# Patient Record
Sex: Female | Born: 2000 | Race: Black or African American | Hispanic: No | Marital: Single | Smoking: Never smoker
Health system: Southern US, Community
[De-identification: ages and names within clinical notes are randomized; demographics above are authoritative.]

## PROBLEM LIST (undated history)

## (undated) DIAGNOSIS — J309 Allergic rhinitis, unspecified: Secondary | ICD-10-CM

## (undated) DIAGNOSIS — J45909 Unspecified asthma, uncomplicated: Secondary | ICD-10-CM

## (undated) HISTORY — DX: Allergic rhinitis, unspecified: J30.9

## (undated) HISTORY — DX: Unspecified asthma, uncomplicated: J45.909

---
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
## 2020-03-19 DIAGNOSIS — H2142 Pupillary membranes, left eye: Secondary | ICD-10-CM | POA: Diagnosis not present

## 2020-03-19 DIAGNOSIS — H47393 Other disorders of optic disc, bilateral: Secondary | ICD-10-CM | POA: Diagnosis not present

## 2020-03-19 DIAGNOSIS — Z83511 Family history of glaucoma: Secondary | ICD-10-CM | POA: Diagnosis not present

## 2020-03-19 DIAGNOSIS — R519 Headache, unspecified: Secondary | ICD-10-CM | POA: Diagnosis not present

## 2022-01-01 ENCOUNTER — Encounter: Payer: Self-pay | Admitting: Family Medicine

## 2022-01-01 ENCOUNTER — Ambulatory Visit: Payer: BC Managed Care – PPO | Admitting: Family Medicine

## 2022-01-01 VITALS — BP 114/70 | HR 88 | Temp 97.7°F | Resp 16 | Ht 66.5 in | Wt 165.4 lb

## 2022-01-01 DIAGNOSIS — J302 Other seasonal allergic rhinitis: Secondary | ICD-10-CM

## 2022-01-01 DIAGNOSIS — M25562 Pain in left knee: Secondary | ICD-10-CM

## 2022-01-01 DIAGNOSIS — F419 Anxiety disorder, unspecified: Secondary | ICD-10-CM | POA: Diagnosis not present

## 2022-01-01 DIAGNOSIS — J4520 Mild intermittent asthma, uncomplicated: Secondary | ICD-10-CM | POA: Diagnosis not present

## 2022-01-01 NOTE — Progress Notes (Signed)
? ?Subjective:  ? ? Patient ID: Ann Mckinney, female    DOB: 07-16-2001, 21 y.o.   MRN: [id number] ? ?HPI ?New to establish.  Previous PCP- Dr Dion Body.  Last seen ~1 yr ago. ? ?L knee pain- sxs started ~1 month ago.  Noted a bony lump on L patella.  This is the area where she will have pain.  No known injury.  Was active in track and field.  Pain tends to come and go but lump remains constant. ? ?Allergies- pt has mix of seasonal/food/pet allergies.  Completed round of allergy shots.  Has been doing much better.  Pt is not even having to use her Zyrtec at this time. ? ?Asthma- currently well controlled and managing w/ albuterol as needed.  Rarely requires inhaler.  Tends to have issues if she has respiratory illness or rapid weather changes. ? ?Anxiety- sxs started 'a couple of months ago'.  'it's a constant thing.  I'm always worried about something'.  Feels on edge.  Increased irritability.  Racing thoughts.  Pt reports she is having more bad days than good.  Is supposed to start therapy upcoming.   ? 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  Not currently in a relationship.  No concerns for sexually transmitted infections.  Uses condoms.  Not smoking or vaping.  No alcohol.  No drug use.  Wearing seatbelt.  Goes to the gym regularly.  ? ? ?Review of Systems ?For ROS see HPI  ? ?This visit occurred during the SARS-CoV-2 public health emergency.  Safety protocols were in place, including screening questions prior to the visit, additional usage of staff PPE, and extensive cleaning of exam room while observing appropriate contact time as indicated for disinfecting solutions.   ?   ?Objective:  ? Physical Exam ?Vitals reviewed.  ?Constitutional:   ?   General: She is not in acute distress. ?   Appearance: Normal appearance. She is well-developed. She is not ill-appearing.  ?HENT:  ?   Head: Normocephalic and atraumatic.  ?Eyes:  ?   Conjunctiva/sclera: Conjunctivae normal.  ?   Pupils: Pupils  are equal, round, and reactive to light.  ?Neck:  ?   Thyroid: No thyromegaly.  ?Cardiovascular:  ?   Rate and Rhythm: Normal rate and regular rhythm.  ?   Pulses: Normal pulses.  ?   Heart sounds: Normal heart sounds. No murmur heard. ?Pulmonary:  ?   Effort: Pulmonary effort is normal. No respiratory distress.  ?   Breath sounds: Normal breath sounds.  ?Abdominal:  ?   General: There is no distension.  ?   Palpations: Abdomen is soft.  ?   Tenderness: There is no abdominal tenderness.  ?Musculoskeletal:     ?   General: Deformity (bony deformity noted on upper lateral patella, TTP) present.  ?   Cervical back: Normal range of motion and neck supple.  ?   Right lower leg: No edema.  ?   Left lower leg: No edema.  ?Lymphadenopathy:  ?   Cervical: No cervical adenopathy.  ?Skin: ?   General: Skin is warm and dry.  ?Neurological:  ?   General: No focal deficit present.  ?   Mental Status: She is alert and oriented to person, place, and time.  ?Psychiatric:     ?   Mood and Affect: Mood normal.     ?   Behavior: Behavior normal.     ?   Thought Content: Thought content normal.  ? ? ? ? ? ?   ?  Assessment & Plan:  ? ? ?

## 2022-01-01 NOTE — Patient Instructions (Addendum)
Schedule your complete physical in 6 months ?IF you need to revisit the idea of medicine- let me know! ?We'll call you with your Sports Med appt ?If you have knee pain- ibuprofen and ice!! ?You should be SO proud of yourself!! ?Keep up the good work!  You look fabulous!!! ?Call with any questions or concerns ?Welcome!  We're glad to have you!!! ?

## 2022-01-04 NOTE — Assessment & Plan Note (Signed)
New to provider, ongoing for pt.  She reports much better control since having allergy shots.  Not even taking Zyrtec at this time.  Will follow. ?

## 2022-01-04 NOTE — Assessment & Plan Note (Signed)
New.  Pt w/ bony deformity on superior-lateral edge of patella.  + TTP.  Will refer to Sports Medicine for complete evaluation and tx.  Pt expressed understanding and is in agreement w/ plan.  ?

## 2022-01-04 NOTE — Assessment & Plan Note (Signed)
New.  Pt reports sxs started a few months ago and she feels that she is constantly worrying about something.  Has increased irritability, racing thoughts.  Feels she is having more bad days than good.  Is not interested in meds at this time, but does plan to start therapy.  Wants to see how this goes first before taking medication.  I understand and told her we can revisit this at any time. ?

## 2022-01-04 NOTE — Assessment & Plan Note (Signed)
New to provider, ongoing for pt.  Reports that she is rarely requiring albuterol at this time b/c her allergies are under much better control.  Typically only flares w/ extreme weather changes and URIs. ?

## 2022-01-20 ENCOUNTER — Ambulatory Visit (INDEPENDENT_AMBULATORY_CARE_PROVIDER_SITE_OTHER): Payer: BC Managed Care – PPO | Admitting: Family Medicine

## 2022-01-20 ENCOUNTER — Encounter: Payer: Self-pay | Admitting: Family Medicine

## 2022-01-20 ENCOUNTER — Ambulatory Visit: Payer: Self-pay

## 2022-01-20 VITALS — BP 106/70 | Ht 66.5 in | Wt 165.0 lb

## 2022-01-20 DIAGNOSIS — M76899 Other specified enthesopathies of unspecified lower limb, excluding foot: Secondary | ICD-10-CM | POA: Diagnosis not present

## 2022-01-20 DIAGNOSIS — M25562 Pain in left knee: Secondary | ICD-10-CM

## 2022-01-20 NOTE — Assessment & Plan Note (Signed)
Acutely occurring.  Pain likely associated with the spurring appreciated off the patella ?-Counseled on home exercise therapy and supportive care. ?-Pursue shockwave therapy ?

## 2022-01-20 NOTE — Patient Instructions (Signed)
Nice to meet you ?Please try heat on the area  ?Please try the exercises   ?Please send me a message in MyChart with any questions or updates.  ?Please see me back to start shockwave therapy.  ? ?--Dr. Jordan Likes ? ?

## 2022-01-20 NOTE — Progress Notes (Signed)
?  Ann Mckinney - 21 y.o. female MRN [id number]  Date of birth: 03-08-2001 ? ?SUBJECTIVE:  Including CC & ROS.  ?No chief complaint on file. ? ? ?Ann Mckinney is a 21 y.o. female that is presenting with acute left knee pain.  The pain is occurring over the patella.  Denies any injury.  It is worse when she stands at work.  Does get swelling intermittently.  No pain with lifting weights or exercising. ? ? ?Review of Systems ?See HPI  ? ?HISTORY: Past Medical, Surgical, Social, and Family History Reviewed & Updated per EMR.   ?Pertinent Historical Findings include: ? ?Past Medical History:  ?Diagnosis Date  ? Allergic rhinitis   ? Asthma   ? induced by allergies  ? ? ?Past Surgical History:  ?Procedure Laterality Date  ? WISDOM TOOTH EXTRACTION  05/2017  ? ? ? ?PHYSICAL EXAM:  ?VS: BP 106/70 (BP Location: Left Arm, Patient Position: Sitting)   Ht 5' 6.5" (1.689 m)   Wt 165 lb (74.8 kg)   BMI 26.23 kg/m?  ?Physical Exam ?Gen: NAD, alert, cooperative with exam, well-appearing ?MSK:  ?Neurovascularly intact   ? ?Limited ultrasound: Left knee: ? ?No effusion suprapatellar pouch. ?There is a spur within the quadriceps tendon where she localizes the most intense pain. ?Normal-appearing patellar tendon. ?Normal-appearing medial and lateral joint space ? ?Summary: Spurring into the quadriceps tendon ? ?Ultrasound and interpretation by Clare Gandy, MD ? ? ? ?ASSESSMENT & PLAN:  ? ?Quadriceps tendinitis ?Acutely occurring.  Pain likely associated with the spurring appreciated off the patella ?-Counseled on home exercise therapy and supportive care. ?-Pursue shockwave therapy ? ? ? ? ?

## 2022-01-26 ENCOUNTER — Ambulatory Visit: Payer: BC Managed Care – PPO | Admitting: Family Medicine

## 2022-02-02 ENCOUNTER — Encounter: Payer: Self-pay | Admitting: Family Medicine

## 2022-02-02 ENCOUNTER — Ambulatory Visit (INDEPENDENT_AMBULATORY_CARE_PROVIDER_SITE_OTHER): Payer: Self-pay | Admitting: Family Medicine

## 2022-02-02 DIAGNOSIS — M76899 Other specified enthesopathies of unspecified lower limb, excluding foot: Secondary | ICD-10-CM

## 2022-02-02 NOTE — Progress Notes (Signed)
?  Ann Mckinney - 21 y.o. female MRN [id number]  Date of birth: Oct 16, 2001 ? ?SUBJECTIVE:  Including CC & ROS.  ?No chief complaint on file. ? ? ?Ann Mckinney is a 21 y.o. female that is here for shockwave therapy. ? ? ?Review of Systems ?See HPI  ? ?HISTORY: Past Medical, Surgical, Social, and Family History Reviewed & Updated per EMR.   ?Pertinent Historical Findings include: ? ?Past Medical History:  ?Diagnosis Date  ? Allergic rhinitis   ? Asthma   ? induced by allergies  ? ? ?Past Surgical History:  ?Procedure Laterality Date  ? WISDOM TOOTH EXTRACTION  05/2017  ? ? ? ?PHYSICAL EXAM:  ?VS: Ht 5' 6.5" (1.689 m)   Wt 165 lb (74.8 kg)   BMI 26.23 kg/m?  ?Physical Exam ?Gen: NAD, alert, cooperative with exam, well-appearing ?MSK:  ?Neurovascularly intact   ? ?ECSWT Note ?Ann Mckinney ?11/29/2000 ? ?Procedure: ECSWT ?Indications: left knee pain  ? ?Procedure Details ?Consent: Risks of procedure as well as the alternatives and risks of each were explained to the (patient/caregiver).  Consent for procedure obtained. ?Time Out: Verified patient identification, verified procedure, site/side was marked, verified correct patient position, special equipment/implants available, medications/allergies/relevent history reviewed, required imaging and test results available.  Performed.  The area was cleaned with iodine and alcohol swabs.   ? ?The left knee was targeted for Extracorporeal shockwave therapy.  ? ?Preset: Patellar tendinopathy ?Power Level: 70 ?Frequency: 10 ?Impulse/cycles: 2200 ?Head size: Large ?Session: 1st ? ?Patient did tolerate procedure well. ? ? ? ?ASSESSMENT & PLAN:  ? ?Quadriceps tendinitis ?Completed shockwave therapy. ? ? ? ? ?

## 2022-02-02 NOTE — Assessment & Plan Note (Signed)
Completed shockwave therapy  

## 2022-02-13 DIAGNOSIS — F4325 Adjustment disorder with mixed disturbance of emotions and conduct: Secondary | ICD-10-CM | POA: Diagnosis not present

## 2022-02-18 ENCOUNTER — Ambulatory Visit (INDEPENDENT_AMBULATORY_CARE_PROVIDER_SITE_OTHER): Payer: BC Managed Care – PPO | Admitting: Family Medicine

## 2022-02-18 ENCOUNTER — Ambulatory Visit: Payer: Self-pay

## 2022-02-18 ENCOUNTER — Encounter: Payer: Self-pay | Admitting: Family Medicine

## 2022-02-18 VITALS — BP 98/60 | Ht 66.5 in | Wt 165.0 lb

## 2022-02-18 DIAGNOSIS — M222X1 Patellofemoral disorders, right knee: Secondary | ICD-10-CM | POA: Diagnosis not present

## 2022-02-18 DIAGNOSIS — M25561 Pain in right knee: Secondary | ICD-10-CM

## 2022-02-18 NOTE — Progress Notes (Signed)
?  Ann Mckinney - 21 y.o. female MRN [id number]  Date of birth: 12-13-2000 ? ?SUBJECTIVE:  Including CC & ROS.  ?No chief complaint on file. ? ? ?Ann Mckinney is a 22 y.o. female that is presenting with acute right knee pain.  She is feeling tilting of the kneecap while she was doing a squat.  No specific injury.  Pain is anterior nature. ? ? ? ?Review of Systems ?See HPI  ? ?HISTORY: Past Medical, Surgical, Social, and Family History Reviewed & Updated per EMR.   ?Pertinent Historical Findings include: ? ?Past Medical History:  ?Diagnosis Date  ? Allergic rhinitis   ? Asthma   ? induced by allergies  ? ? ?Past Surgical History:  ?Procedure Laterality Date  ? WISDOM TOOTH EXTRACTION  05/2017  ? ? ? ?PHYSICAL EXAM:  ?VS: BP 98/60 (BP Location: Left Arm, Patient Position: Sitting)   Ht 5' 6.5" (1.689 m)   Wt 165 lb (74.8 kg)   BMI 26.23 kg/m?  ?Physical Exam ?Gen: NAD, alert, cooperative with exam, well-appearing ?MSK:  ?Neurovascularly intact   ? ?Limited ultrasound: Right knee: ? ?No effusion in the suprapatellar pouch. ?Normal-appearing quadricep and patellar tendon. ?No changes in the medial or lateral compartment. ? ?Summary: No structural changes appreciated ? ?Ultrasound and interpretation by Clare Gandy, MD ? ? ? ?ASSESSMENT & PLAN:  ? ?Patellofemoral pain syndrome of right knee ?Acutely occurring.  Symptoms most consistent with patellofemoral syndrome ?-Counseled on home exercise therapy and supportive care. ?-Could consider insoles or  orthotics given she has more pes planus feet. ?-Could consider physical therapy. ? ? ? ? ?

## 2022-02-18 NOTE — Assessment & Plan Note (Signed)
Acutely occurring.  Symptoms most consistent with patellofemoral syndrome ?-Counseled on home exercise therapy and supportive care. ?-Could consider insoles or  orthotics given she has more pes planus feet. ?-Could consider physical therapy. ?

## 2022-02-18 NOTE — Patient Instructions (Signed)
Good to see you ?Please use ice as needed  ?Please try the exercises  ?You can try voltaren over the counter as needed  ?Please send me a message in MyChart with any questions or updates.  ?Please see me back in 4 weeks or as needed if better.  ? ?--Dr. Jordan Likes  ? ?

## 2022-02-20 DIAGNOSIS — F4320 Adjustment disorder, unspecified: Secondary | ICD-10-CM | POA: Diagnosis not present

## 2022-02-25 DIAGNOSIS — F4320 Adjustment disorder, unspecified: Secondary | ICD-10-CM | POA: Diagnosis not present

## 2022-03-13 DIAGNOSIS — F4320 Adjustment disorder, unspecified: Secondary | ICD-10-CM | POA: Diagnosis not present

## 2022-03-18 ENCOUNTER — Ambulatory Visit (INDEPENDENT_AMBULATORY_CARE_PROVIDER_SITE_OTHER): Payer: BC Managed Care – PPO | Admitting: Family Medicine

## 2022-03-18 ENCOUNTER — Encounter: Payer: Self-pay | Admitting: Family Medicine

## 2022-03-18 DIAGNOSIS — M222X1 Patellofemoral disorders, right knee: Secondary | ICD-10-CM

## 2022-03-18 NOTE — Assessment & Plan Note (Signed)
Not currently having any pain.  Still having a little crackly sensation from time to time. -Counseled on home exercise therapy and supportive care. -Could consider physical therapy or further imaging.

## 2022-03-18 NOTE — Progress Notes (Signed)
  Ann Mckinney - 21 y.o. female MRN [id number]  Date of birth: February 20, 2001  SUBJECTIVE:  Including CC & ROS.  No chief complaint on file.   Ann Mckinney is a 21 y.o. female that is following up for her right knee pain.  She is still having some crackling sensation but no pain.  She is still active.    Review of Systems See HPI   HISTORY: Past Medical, Surgical, Social, and Family History Reviewed & Updated per EMR.   Pertinent Historical Findings include:  Past Medical History:  Diagnosis Date   Allergic rhinitis    Asthma    induced by allergies    Past Surgical History:  Procedure Laterality Date   WISDOM TOOTH EXTRACTION  05/2017     PHYSICAL EXAM:  VS: BP (!) 108/58 (BP Location: Left Arm, Patient Position: Sitting)   Ht 5' 6.5" (1.689 m)   Wt 165 lb (74.8 kg)   BMI 26.23 kg/m  Physical Exam Gen: NAD, alert, cooperative with exam, well-appearing MSK:  Neurovascularly intact       ASSESSMENT & PLAN:   Patellofemoral pain syndrome of right knee Not currently having any pain.  Still having a little crackly sensation from time to time. -Counseled on home exercise therapy and supportive care. -Could consider physical therapy or further imaging.

## 2022-04-01 DIAGNOSIS — F4325 Adjustment disorder with mixed disturbance of emotions and conduct: Secondary | ICD-10-CM | POA: Diagnosis not present

## 2022-04-08 DIAGNOSIS — F4325 Adjustment disorder with mixed disturbance of emotions and conduct: Secondary | ICD-10-CM | POA: Diagnosis not present

## 2022-04-13 DIAGNOSIS — F4325 Adjustment disorder with mixed disturbance of emotions and conduct: Secondary | ICD-10-CM | POA: Diagnosis not present

## 2022-05-27 DIAGNOSIS — S41051A Open bite of right shoulder, initial encounter: Secondary | ICD-10-CM | POA: Diagnosis not present

## 2022-05-27 DIAGNOSIS — S61451A Open bite of right hand, initial encounter: Secondary | ICD-10-CM | POA: Diagnosis not present

## 2022-05-27 DIAGNOSIS — S51851A Open bite of right forearm, initial encounter: Secondary | ICD-10-CM | POA: Diagnosis not present

## 2022-05-27 DIAGNOSIS — W540XXA Bitten by dog, initial encounter: Secondary | ICD-10-CM | POA: Diagnosis not present

## 2022-06-29 ENCOUNTER — Telehealth: Payer: Self-pay | Admitting: Family Medicine

## 2022-06-29 NOTE — Telephone Encounter (Signed)
Attempted to reach pt to see exactly what test she is needing . Unable to leave a VM due to mailbox is full and no answer

## 2022-06-29 NOTE — Telephone Encounter (Signed)
Caller name: Anniston Nellums  On DPR? :yes/no: Yes  Call back number: [phone]  Provider they see: Beverely Low  Reason for call:  Pt calling b/c stating that she needs an irgra test. Please advise.

## 2022-06-30 ENCOUNTER — Other Ambulatory Visit: Payer: Self-pay

## 2022-06-30 ENCOUNTER — Telehealth: Payer: Self-pay | Admitting: Family Medicine

## 2022-06-30 DIAGNOSIS — Z111 Encounter for screening for respiratory tuberculosis: Secondary | ICD-10-CM

## 2022-06-30 NOTE — Telephone Encounter (Signed)
Pt is coming in tomorrow for lab visit only and order for TB is in

## 2022-06-30 NOTE — Telephone Encounter (Signed)
Caller name: Jynesis Nakamura   On DPR? 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

## 2022-07-01 ENCOUNTER — Other Ambulatory Visit: Payer: BC Managed Care – PPO

## 2022-07-01 DIAGNOSIS — Z111 Encounter for screening for respiratory tuberculosis: Secondary | ICD-10-CM | POA: Diagnosis not present

## 2022-07-03 DIAGNOSIS — F4325 Adjustment disorder with mixed disturbance of emotions and conduct: Secondary | ICD-10-CM | POA: Diagnosis not present

## 2022-07-03 LAB — QUANTIFERON-TB GOLD PLUS
Mitogen-NIL: >10 IU/mL
NIL: 0.03 IU/mL
QuantiFERON-TB Gold Plus: NEGATIVE
TB1-NIL: <0 IU/mL
TB2-NIL: 0 IU/mL

## 2022-07-05 ENCOUNTER — Encounter: Payer: BC Managed Care – PPO | Admitting: Family Medicine

## 2022-07-09 ENCOUNTER — Encounter: Payer: Self-pay | Admitting: Family Medicine

## 2022-07-09 ENCOUNTER — Ambulatory Visit (INDEPENDENT_AMBULATORY_CARE_PROVIDER_SITE_OTHER): Payer: BC Managed Care – PPO | Admitting: Family Medicine

## 2022-07-09 VITALS — BP 110/70 | HR 86 | Temp 97.9°F | Resp 18 | Ht 66.5 in | Wt 166.6 lb

## 2022-07-09 DIAGNOSIS — Z114 Encounter for screening for human immunodeficiency virus [HIV]: Secondary | ICD-10-CM

## 2022-07-09 DIAGNOSIS — Z1159 Encounter for screening for other viral diseases: Secondary | ICD-10-CM

## 2022-07-09 DIAGNOSIS — Z0000 Encounter for general adult medical examination without abnormal findings: Secondary | ICD-10-CM

## 2022-07-09 DIAGNOSIS — E663 Overweight: Secondary | ICD-10-CM | POA: Insufficient documentation

## 2022-07-09 NOTE — Patient Instructions (Signed)
Follow up in 1 year or as needed We'll notify you of your lab results and make any changes if needed Continue to work on healthy diet and regular exercise- you look great! Please have GYN send me a copy of their report next month Call with any questions or concerns Stay Safe!  Stay Healthy! Happy Fall!!

## 2022-07-09 NOTE — Progress Notes (Signed)
   Subjective:    Patient ID: Ann Mckinney, female    DOB: October 03, 2001, 21 y.o.   MRN: [id number]  HPI CPE- due for HIV screen, Hep C screen.  UTD on Tdap, flu.  Scheduled for pap next month.  Pt reports things are going well.  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  No vaping or smoking, no alcohol, no drug use.  Not dating at the moment.    Health Maintenance  Topic Date Due   HIV Screening  Never done   Hepatitis C Screening  Never done   TETANUS/TDAP  05/27/2032   INFLUENZA VACCINE  Completed   HPV VACCINES  Completed     Review of Systems Patient reports no vision/ hearing changes, adenopathy,fever, weight change,  persistant/recurrent hoarseness , swallowing issues, chest pain, palpitations, edema, persistant/recurrent cough, hemoptysis, dyspnea (rest/exertional/paroxysmal nocturnal), gastrointestinal bleeding (melena, rectal bleeding), abdominal pain, significant heartburn, bowel changes, GU symptoms (dysuria, hematuria, incontinence), Gyn symptoms (abnormal  bleeding, pain),  syncope, focal weakness, memory loss, numbness & tingling, skin/hair/nail changes, abnormal bruising or bleeding, anxiety, or depression.     Objective:   Physical Exam General Appearance:    Alert, cooperative, no distress, appears stated age  Head:    Normocephalic, without obvious abnormality, atraumatic  Eyes:    PERRL, conjunctiva/corneas clear, EOM's intact both eyes  Ears:    Normal TM's and external ear canals, both ears  Nose:   Nares normal, septum midline, mucosa normal, no drainage    or sinus tenderness  Throat:   Lips, mucosa, and tongue normal; teeth and gums normal  Neck:   Supple, symmetrical, trachea midline, no adenopathy;    Thyroid: no enlargement/tenderness/nodules  Back:     Symmetric, no curvature, ROM normal, no CVA tenderness  Lungs:     Clear to auscultation bilaterally, respirations unlabored  Chest Wall:    No tenderness or deformity   Heart:     Regular rate and rhythm, S1 and S2 normal, no murmur, rub   or gallop  Breast Exam:    Deferred to GYN  Abdomen:     Soft, non-tender, bowel sounds active all four quadrants,    no masses, no organomegaly  Genitalia:    Deferred to GYN  Rectal:    Extremities:   Extremities normal, atraumatic, no cyanosis or edema  Pulses:   2+ and symmetric all extremities  Skin:   Skin color, texture, turgor normal, no rashes or lesions  Lymph nodes:   Cervical, supraclavicular, and axillary nodes normal  Neurologic:   CNII-XII intact, normal strength, sensation and reflexes    throughout          Assessment & Plan:

## 2022-07-09 NOTE — Assessment & Plan Note (Signed)
BMI 26.49  Encouraged low carb diet, regular exercise.  Check labs to risk stratify.  Will follow.

## 2022-07-09 NOTE — Assessment & Plan Note (Signed)
Pt's PE WNL.  UTD on Tdap, flu.  Has GYN scheduled next month.  Check labs.  Anticipatory guidance provided.

## 2022-07-10 DIAGNOSIS — F4325 Adjustment disorder with mixed disturbance of emotions and conduct: Secondary | ICD-10-CM | POA: Diagnosis not present

## 2022-07-10 LAB — HEPATIC FUNCTION PANEL
AG Ratio: 1.7 (calc) (ref 1.0–2.5)
ALT: 9 U/L (ref 6–29)
AST: 20 U/L (ref 10–30)
Albumin: 4.5 g/dL (ref 3.6–5.1)
Alkaline phosphatase (APISO): 49 U/L (ref 31–125)
Bilirubin, Direct: 0.1 mg/dL (ref 0.0–0.2)
Globulin: 2.6 g/dL (calc) (ref 1.9–3.7)
Indirect Bilirubin: 0.3 mg/dL (calc) (ref 0.2–1.2)
Total Bilirubin: 0.4 mg/dL (ref 0.2–1.2)
Total Protein: 7.1 g/dL (ref 6.1–8.1)

## 2022-07-10 LAB — CBC WITH DIFFERENTIAL/PLATELET
Absolute Monocytes: 759 cells/uL (ref 200–950)
Basophils Absolute: 41 cells/uL (ref 0–200)
Basophils Relative: 0.6 %
Eosinophils Absolute: 269 cells/uL (ref 15–500)
Eosinophils Relative: 3.9 %
HCT: 39 % (ref 35.0–45.0)
Hemoglobin: 12.9 g/dL (ref 11.7–15.5)
Lymphs Abs: 2939 cells/uL (ref 850–3900)
MCH: 28.2 pg (ref 27.0–33.0)
MCHC: 33.1 g/dL (ref 32.0–36.0)
MCV: 85.3 fL (ref 80.0–100.0)
MPV: 10.8 fL (ref 7.5–12.5)
Monocytes Relative: 11 %
Neutro Abs: 2891 cells/uL (ref 1500–7800)
Neutrophils Relative %: 41.9 %
Platelets: 322 10*3/uL (ref 140–400)
RBC: 4.57 10*6/uL (ref 3.80–5.10)
RDW: 13.6 % (ref 11.0–15.0)
Total Lymphocyte: 42.6 %
WBC: 6.9 10*3/uL (ref 3.8–10.8)

## 2022-07-10 LAB — VITAMIN D 25 HYDROXY (VIT D DEFICIENCY, FRACTURES): Vit D, 25-Hydroxy: 14 ng/mL — ABNORMAL LOW (ref 30–100)

## 2022-07-10 LAB — LIPID PANEL
Cholesterol: 136 mg/dL (ref <200)
HDL: 65 mg/dL (ref >=50)
LDL Cholesterol (Calc): 59 mg/dL (calc)
Non-HDL Cholesterol (Calc): 71 mg/dL (calc) (ref <130)
Total CHOL/HDL Ratio: 2.1 (calc) (ref <5.0)
Triglycerides: 41 mg/dL (ref <150)

## 2022-07-10 LAB — BASIC METABOLIC PANEL
BUN: 11 mg/dL (ref 7–25)
CO2: 26 mmol/L (ref 20–32)
Calcium: 9.8 mg/dL (ref 8.6–10.2)
Chloride: 106 mmol/L (ref 98–110)
Creat: 0.87 mg/dL (ref 0.50–0.96)
Glucose, Bld: 90 mg/dL (ref 65–99)
Potassium: 4.5 mmol/L (ref 3.5–5.3)
Sodium: 140 mmol/L (ref 135–146)

## 2022-07-10 LAB — HEPATITIS C ANTIBODY: Hepatitis C Ab: NONREACTIVE

## 2022-07-10 LAB — HIV ANTIBODY (ROUTINE TESTING W REFLEX): HIV 1&2 Ab, 4th Generation: NONREACTIVE

## 2022-07-10 LAB — TSH: TSH: 0.96 mIU/L

## 2022-07-12 ENCOUNTER — Other Ambulatory Visit: Payer: Self-pay

## 2022-07-12 MED ORDER — VITAMIN D (ERGOCALCIFEROL) 1.25 MG (50000 UNIT) PO CAPS: 50000.0000 [IU] | ORAL_CAPSULE | ORAL | 12 refills | Status: DC

## 2022-07-12 NOTE — Progress Notes (Signed)
Left pt a VM stating lab results . Vitamin d has been sent to pharmacy

## 2022-07-13 DIAGNOSIS — Z01419 Encounter for gynecological examination (general) (routine) without abnormal findings: Secondary | ICD-10-CM | POA: Diagnosis not present

## 2022-07-13 DIAGNOSIS — Z113 Encounter for screening for infections with a predominantly sexual mode of transmission: Secondary | ICD-10-CM | POA: Diagnosis not present

## 2022-07-13 DIAGNOSIS — Z6827 Body mass index (BMI) 27.0-27.9, adult: Secondary | ICD-10-CM | POA: Diagnosis not present

## 2022-07-24 DIAGNOSIS — F4325 Adjustment disorder with mixed disturbance of emotions and conduct: Secondary | ICD-10-CM | POA: Diagnosis not present

## 2022-12-30 DIAGNOSIS — F4325 Adjustment disorder with mixed disturbance of emotions and conduct: Secondary | ICD-10-CM | POA: Diagnosis not present

## 2023-01-06 DIAGNOSIS — F4325 Adjustment disorder with mixed disturbance of emotions and conduct: Secondary | ICD-10-CM | POA: Diagnosis not present

## 2023-01-11 DIAGNOSIS — F4325 Adjustment disorder with mixed disturbance of emotions and conduct: Secondary | ICD-10-CM | POA: Diagnosis not present

## 2023-01-27 DIAGNOSIS — F4325 Adjustment disorder with mixed disturbance of emotions and conduct: Secondary | ICD-10-CM | POA: Diagnosis not present

## 2023-02-07 ENCOUNTER — Encounter: Payer: Self-pay | Admitting: *Deleted

## 2023-06-25 DIAGNOSIS — F4325 Adjustment disorder with mixed disturbance of emotions and conduct: Secondary | ICD-10-CM | POA: Diagnosis not present

## 2023-06-29 DIAGNOSIS — F4325 Adjustment disorder with mixed disturbance of emotions and conduct: Secondary | ICD-10-CM | POA: Diagnosis not present

## 2023-06-29 DIAGNOSIS — F411 Generalized anxiety disorder: Secondary | ICD-10-CM | POA: Diagnosis not present

## 2023-07-15 ENCOUNTER — Other Ambulatory Visit: Payer: BC Managed Care – PPO

## 2023-07-15 ENCOUNTER — Ambulatory Visit: Payer: BC Managed Care – PPO

## 2023-07-15 DIAGNOSIS — Z111 Encounter for screening for respiratory tuberculosis: Secondary | ICD-10-CM

## 2023-07-18 ENCOUNTER — Telehealth: Payer: Self-pay

## 2023-07-18 LAB — QUANTIFERON-TB GOLD PLUS
Mitogen-NIL: >10 IU/mL
NIL: 0.02 IU/mL
QuantiFERON-TB Gold Plus: NEGATIVE
TB1-NIL: 0.02 IU/mL
TB2-NIL: 0 IU/mL

## 2023-07-18 NOTE — Telephone Encounter (Signed)
Pt has been notified.

## 2023-07-18 NOTE — Telephone Encounter (Signed)
-----   Message from Neena Rhymes sent at 07/18/2023  7:23 AM EDT ----- Negative (normal) quantiferon test.  No evidence of TB

## 2023-07-23 DIAGNOSIS — F4325 Adjustment disorder with mixed disturbance of emotions and conduct: Secondary | ICD-10-CM | POA: Diagnosis not present

## 2023-07-29 ENCOUNTER — Encounter: Payer: BC Managed Care – PPO | Admitting: Family Medicine

## 2023-07-30 DIAGNOSIS — F4325 Adjustment disorder with mixed disturbance of emotions and conduct: Secondary | ICD-10-CM | POA: Diagnosis not present

## 2023-08-09 DIAGNOSIS — Z124 Encounter for screening for malignant neoplasm of cervix: Secondary | ICD-10-CM | POA: Diagnosis not present

## 2023-08-09 DIAGNOSIS — Z01419 Encounter for gynecological examination (general) (routine) without abnormal findings: Secondary | ICD-10-CM | POA: Diagnosis not present

## 2023-08-09 DIAGNOSIS — Z113 Encounter for screening for infections with a predominantly sexual mode of transmission: Secondary | ICD-10-CM | POA: Diagnosis not present

## 2023-08-09 LAB — HM PAP SMEAR: HM Pap smear: NORMAL

## 2023-08-09 LAB — RESULTS CONSOLE HPV: CHL HPV: NEGATIVE

## 2023-08-11 DIAGNOSIS — F4325 Adjustment disorder with mixed disturbance of emotions and conduct: Secondary | ICD-10-CM | POA: Diagnosis not present

## 2023-08-20 DIAGNOSIS — F4325 Adjustment disorder with mixed disturbance of emotions and conduct: Secondary | ICD-10-CM | POA: Diagnosis not present

## 2023-09-13 ENCOUNTER — Telehealth: Payer: Self-pay

## 2023-09-13 NOTE — Telephone Encounter (Signed)
Appointment Request From: Arlice Colt   With Provider: Neena Rhymes [facility]]   Preferred Date Range: 09/25/2023 - 10/25/2023   Preferred Times: Any Time   Reason for visit: Annual Physical   Comments: Just annual physical      Called patient and attempted to schedule for the afternoon of 10/06/2023 as Dr Beverely Low does have one opening, no answer LM to call back so we can schedule her

## 2023-09-14 DIAGNOSIS — F4325 Adjustment disorder with mixed disturbance of emotions and conduct: Secondary | ICD-10-CM | POA: Diagnosis not present

## 2023-10-01 DIAGNOSIS — F4325 Adjustment disorder with mixed disturbance of emotions and conduct: Secondary | ICD-10-CM | POA: Diagnosis not present

## 2023-10-29 DIAGNOSIS — F4325 Adjustment disorder with mixed disturbance of emotions and conduct: Secondary | ICD-10-CM | POA: Diagnosis not present

## 2023-12-21 ENCOUNTER — Ambulatory Visit (INDEPENDENT_AMBULATORY_CARE_PROVIDER_SITE_OTHER): Payer: BC Managed Care – PPO | Admitting: Family Medicine

## 2023-12-21 ENCOUNTER — Encounter: Payer: Self-pay | Admitting: Family Medicine

## 2023-12-21 VITALS — BP 102/64 | HR 83 | Temp 97.8°F | Ht 66.5 in | Wt 164.1 lb

## 2023-12-21 DIAGNOSIS — R5383 Other fatigue: Secondary | ICD-10-CM

## 2023-12-21 DIAGNOSIS — Z0000 Encounter for general adult medical examination without abnormal findings: Secondary | ICD-10-CM

## 2023-12-21 DIAGNOSIS — E663 Overweight: Secondary | ICD-10-CM | POA: Diagnosis not present

## 2023-12-21 LAB — LIPID PANEL
Cholesterol: 106 mg/dL (ref 0–200)
HDL: 52.1 mg/dL (ref >39.00)
LDL Cholesterol: 46 mg/dL (ref 0–99)
NonHDL: 53.89
Total CHOL/HDL Ratio: 2
Triglycerides: 40 mg/dL (ref 0.0–149.0)
VLDL: 8 mg/dL (ref 0.0–40.0)

## 2023-12-21 LAB — TSH: TSH: 4.29 u[IU]/mL (ref 0.35–5.50)

## 2023-12-21 LAB — HEPATIC FUNCTION PANEL
ALT: 10 U/L (ref 0–35)
AST: 20 U/L (ref 0–37)
Albumin: 4.5 g/dL (ref 3.5–5.2)
Alkaline Phosphatase: 40 U/L (ref 39–117)
Bilirubin, Direct: 0.1 mg/dL (ref 0.0–0.3)
Total Bilirubin: 0.3 mg/dL (ref 0.2–1.2)
Total Protein: 7.4 g/dL (ref 6.0–8.3)

## 2023-12-21 LAB — BASIC METABOLIC PANEL
BUN: 11 mg/dL (ref 6–23)
CO2: 27 meq/L (ref 19–32)
Calcium: 9.7 mg/dL (ref 8.4–10.5)
Chloride: 103 meq/L (ref 96–112)
Creatinine, Ser: 0.88 mg/dL (ref 0.40–1.20)
GFR: 93.31 mL/min (ref >60.00)
Glucose, Bld: 88 mg/dL (ref 70–99)
Potassium: 3.7 meq/L (ref 3.5–5.1)
Sodium: 139 meq/L (ref 135–145)

## 2023-12-21 LAB — CBC WITH DIFFERENTIAL/PLATELET
Basophils Absolute: 0 10*3/uL (ref 0.0–0.1)
Basophils Relative: 0.2 % (ref 0.0–3.0)
Eosinophils Absolute: 0.6 10*3/uL (ref 0.0–0.7)
Eosinophils Relative: 7.5 % — ABNORMAL HIGH (ref 0.0–5.0)
HCT: 38.5 % (ref 36.0–46.0)
Hemoglobin: 12.6 g/dL (ref 12.0–15.0)
Lymphocytes Relative: 47.9 % — ABNORMAL HIGH (ref 12.0–46.0)
Lymphs Abs: 3.6 10*3/uL (ref 0.7–4.0)
MCHC: 32.8 g/dL (ref 30.0–36.0)
MCV: 88 fl (ref 78.0–100.0)
Monocytes Absolute: 0.8 10*3/uL (ref 0.1–1.0)
Monocytes Relative: 10.1 % (ref 3.0–12.0)
Neutro Abs: 2.6 10*3/uL (ref 1.4–7.7)
Neutrophils Relative %: 34.3 % — ABNORMAL LOW (ref 43.0–77.0)
Platelets: 280 10*3/uL (ref 150.0–400.0)
RBC: 4.38 Mil/uL (ref 3.87–5.11)
RDW: 14.3 % (ref 11.5–15.5)
WBC: 7.5 10*3/uL (ref 4.0–10.5)

## 2023-12-21 LAB — B12 AND FOLATE PANEL
Folate: 12.3 ng/mL (ref >5.9)
Vitamin B-12: 624 pg/mL (ref 211–911)

## 2023-12-21 LAB — VITAMIN D 25 HYDROXY (VIT D DEFICIENCY, FRACTURES): VITD: 35.08 ng/mL (ref 30.00–100.00)

## 2023-12-21 NOTE — Progress Notes (Signed)
   Subjective:    Patient ID: Ann Mckinney, female    DOB: [birth year], 23 y.o.   MRN: [id number]  HPI CPE- UTD on Tdap, flu.  Had pap at [facility]  Patient Care Team    Relationship Specialty Notifications Start End  Sheliah Hatch, MD PCP - General Family Medicine  01/01/22      Health Maintenance  Topic Date Due   Pneumococcal Vaccine 65-26 Years old (1 of 1 - PPSV23 or PCV20) 08/03/2007   Cervical Cancer Screening (Pap smear)  Never done   COVID-19 Vaccine (1 - 2024-25 season) Never done   DTaP/Tdap/Td (10 - Td or Tdap) 05/27/2032   INFLUENZA VACCINE  Completed   HPV VACCINES  Completed   Hepatitis C Screening  Completed   HIV Screening  Completed      Review of Systems Patient reports no vision/ hearing changes, adenopathy,fever, weight change,  persistant/recurrent hoarseness , swallowing issues, chest pain, palpitations, edema, persistant/recurrent cough, hemoptysis, dyspnea (rest/exertional/paroxysmal nocturnal), gastrointestinal bleeding (melena, rectal bleeding), abdominal pain, significant heartburn, bowel changes, GU symptoms (dysuria, hematuria, incontinence), Gyn symptoms (abnormal  bleeding, pain),  syncope, focal weakness, memory loss, numbness & tingling, skin/hair/nail changes, abnormal bruising or bleeding, anxiety, or depression.     Objective:   Physical Exam General Appearance:    Alert, cooperative, no distress, appears stated age  Head:    Normocephalic, without obvious abnormality, atraumatic  Eyes:    PERRL, conjunctiva/corneas clear, EOM's intact both eyes  Ears:    Normal TM's and external ear canals, both ears  Nose:   Nares normal, septum midline, mucosa normal, no drainage    or sinus tenderness  Throat:   Lips, mucosa, and tongue normal; teeth and gums normal  Neck:   Supple, symmetrical, trachea midline, no adenopathy;    Thyroid: no enlargement/tenderness/nodules  Back:     Symmetric, no curvature, ROM normal, no CVA tenderness  Lungs:      Clear to auscultation bilaterally, respirations unlabored  Chest Wall:    No tenderness or deformity   Heart:    Regular rate and rhythm, S1 and S2 normal, no murmur, rub   or gallop  Breast Exam:    Deferred to GYN  Abdomen:     Soft, non-tender, bowel sounds active all four quadrants,    no masses, no organomegaly  Genitalia:    Deferred to GYN  Rectal:    Extremities:   Extremities normal, atraumatic, no cyanosis or edema  Pulses:   2+ and symmetric all extremities  Skin:   Skin color, texture, turgor normal, no rashes or lesions  Lymph nodes:   Cervical, supraclavicular, and axillary nodes normal  Neurologic:   CNII-XII intact, normal strength, sensation and reflexes    throughout          Assessment & Plan:

## 2023-12-21 NOTE — Patient Instructions (Signed)
Follow up in 1 year or as needed ?We'll notify you of your lab results and make any changes if needed ?Keep up the good work!  You look great! ?Call with any questions or concerns ?Stay Safe!  Stay Healthy!! ?Happy Spring!! ?

## 2023-12-21 NOTE — Assessment & Plan Note (Signed)
 New.  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

## 2023-12-21 NOTE — Assessment & Plan Note (Signed)
Pt's PE WNL.  UTD on pap, Tdap, flu.  Check labs.  Anticipatory guidance provided.  

## 2023-12-22 ENCOUNTER — Encounter: Payer: Self-pay | Admitting: Family Medicine

## 2023-12-22 ENCOUNTER — Telehealth: Payer: Self-pay

## 2023-12-22 NOTE — Telephone Encounter (Signed)
 Patient has been informed of lab results.

## 2023-12-22 NOTE — Telephone Encounter (Signed)
 Called Pt to go over lab results, Left VM to return call

## 2023-12-22 NOTE — Telephone Encounter (Signed)
 Copied from CRM [id number]. Topic: Clinical - Lab/Test Results >> Dec 22, 2023  8:35 AM Almira Coaster wrote: Reason for CRM: Patient is returning a call she received for lab results, advised patient of Dr.Tabori's message in the lab notes.

## 2023-12-22 NOTE — Telephone Encounter (Signed)
-----   Message from Neena Rhymes sent at 12/22/2023  7:38 AM EST ----- Labs look great!  No changes at this time

## 2023-12-28 ENCOUNTER — Encounter: Payer: Self-pay | Admitting: Family Medicine

## 2024-01-06 ENCOUNTER — Encounter: Payer: Self-pay | Admitting: Family Medicine

## 2024-04-23 DIAGNOSIS — F4320 Adjustment disorder, unspecified: Secondary | ICD-10-CM | POA: Diagnosis not present

## 2024-05-02 ENCOUNTER — Other Ambulatory Visit: Payer: Self-pay | Admitting: Family Medicine

## 2024-05-02 DIAGNOSIS — F4320 Adjustment disorder, unspecified: Secondary | ICD-10-CM | POA: Diagnosis not present

## 2024-05-10 ENCOUNTER — Other Ambulatory Visit: Payer: Self-pay | Admitting: Family Medicine

## 2024-05-10 NOTE — Telephone Encounter (Unsigned)
 Copied from CRM [id number]. Topic: Clinical - Medication Refill >> May 10, 2024  4:23 PM Viola F wrote: Medication: cyanocobalamin  2000 MCG tablet [[id number]]  Has the patient contacted their pharmacy? Yes (Agent: If no, request that the patient contact the pharmacy for the refill. If patient does not wish to contact the pharmacy document the reason why and proceed with request.) (Agent: If yes, when and what did the pharmacy advise?)  This is the patient's preferred pharmacy:  [id number] - [place], [place] - [address] [place] [address] Phone: [phone] Fax: [phone]  Is this the correct pharmacy for this prescription? Yes If no, delete pharmacy and type the correct one.   Has the prescription been filled recently? Yes  Is the patient out of the medication? Yes  Has the patient been seen for an appointment in the last year OR does the patient have an upcoming appointment? Yes  Can we respond through MyChart? Yes  Agent: Please be advised that Rx refills may take up to 3 business days. We ask that you follow-up with your pharmacy.

## 2024-05-11 NOTE — Telephone Encounter (Signed)
 Typically this is OTC and it's in his med list as historical and not sent in by Dr. Mahlon. Please advise

## 2024-05-12 MED ORDER — CYANOCOBALAMIN 2000 MCG PO TABS: 2000.0000 ug | ORAL_TABLET | Freq: Every day | ORAL | 1 refills | Status: DC

## 2024-05-18 DIAGNOSIS — F4320 Adjustment disorder, unspecified: Secondary | ICD-10-CM | POA: Diagnosis not present

## 2024-05-30 DIAGNOSIS — F4320 Adjustment disorder, unspecified: Secondary | ICD-10-CM | POA: Diagnosis not present

## 2024-05-31 ENCOUNTER — Encounter: Payer: Self-pay | Admitting: Family Medicine

## 2024-06-13 ENCOUNTER — Ambulatory Visit: Admitting: Family Medicine

## 2024-06-13 DIAGNOSIS — F4320 Adjustment disorder, unspecified: Secondary | ICD-10-CM | POA: Diagnosis not present

## 2024-06-20 DIAGNOSIS — N83201 Unspecified ovarian cyst, right side: Secondary | ICD-10-CM | POA: Diagnosis not present

## 2024-06-20 DIAGNOSIS — R1031 Right lower quadrant pain: Secondary | ICD-10-CM | POA: Diagnosis not present

## 2024-06-20 DIAGNOSIS — R102 Pelvic and perineal pain: Secondary | ICD-10-CM | POA: Diagnosis not present

## 2024-06-20 DIAGNOSIS — N83202 Unspecified ovarian cyst, left side: Secondary | ICD-10-CM | POA: Diagnosis not present

## 2024-06-27 ENCOUNTER — Encounter: Payer: Self-pay | Admitting: Family Medicine

## 2024-06-27 ENCOUNTER — Ambulatory Visit: Admitting: Family Medicine

## 2024-06-27 ENCOUNTER — Ambulatory Visit: Payer: Self-pay | Admitting: Family Medicine

## 2024-06-27 VITALS — BP 96/62 | HR 79 | Temp 98.1°F | Resp 18 | Ht 66.5 in | Wt 161.6 lb

## 2024-06-27 DIAGNOSIS — Z23 Encounter for immunization: Secondary | ICD-10-CM

## 2024-06-27 DIAGNOSIS — R5383 Other fatigue: Secondary | ICD-10-CM | POA: Diagnosis not present

## 2024-06-27 LAB — CBC WITH DIFFERENTIAL/PLATELET
Basophils Absolute: 0 K/uL (ref 0.0–0.1)
Basophils Relative: 0.5 % (ref 0.0–3.0)
Eosinophils Absolute: 0.3 K/uL (ref 0.0–0.7)
Eosinophils Relative: 5.3 % — ABNORMAL HIGH (ref 0.0–5.0)
HCT: 39.6 % (ref 36.0–46.0)
Hemoglobin: 13.1 g/dL (ref 12.0–15.0)
Lymphocytes Relative: 43.3 % (ref 12.0–46.0)
Lymphs Abs: 2.4 K/uL (ref 0.7–4.0)
MCHC: 33.1 g/dL (ref 30.0–36.0)
MCV: 86.6 fl (ref 78.0–100.0)
Monocytes Absolute: 0.6 K/uL (ref 0.1–1.0)
Monocytes Relative: 10 % (ref 3.0–12.0)
Neutro Abs: 2.3 K/uL (ref 1.4–7.7)
Neutrophils Relative %: 40.9 % — ABNORMAL LOW (ref 43.0–77.0)
Platelets: 272 K/uL (ref 150.0–400.0)
RBC: 4.57 Mil/uL (ref 3.87–5.11)
RDW: 14 % (ref 11.5–15.5)
WBC: 5.7 K/uL (ref 4.0–10.5)

## 2024-06-27 LAB — BASIC METABOLIC PANEL WITH GFR
BUN: 10 mg/dL (ref 6–23)
CO2: 27 meq/L (ref 19–32)
Calcium: 9.3 mg/dL (ref 8.4–10.5)
Chloride: 103 meq/L (ref 96–112)
Creatinine, Ser: 0.82 mg/dL (ref 0.40–1.20)
GFR: 101.19 mL/min (ref >60.00)
Glucose, Bld: 85 mg/dL (ref 70–99)
Potassium: 4.2 meq/L (ref 3.5–5.1)
Sodium: 138 meq/L (ref 135–145)

## 2024-06-27 LAB — TSH: TSH: 1.78 u[IU]/mL (ref 0.35–5.50)

## 2024-06-27 LAB — HEPATIC FUNCTION PANEL
ALT: 6 U/L (ref 0–35)
AST: 14 U/L (ref 0–37)
Albumin: 4.5 g/dL (ref 3.5–5.2)
Alkaline Phosphatase: 42 U/L (ref 39–117)
Bilirubin, Direct: 0.1 mg/dL (ref 0.0–0.3)
Total Bilirubin: 0.6 mg/dL (ref 0.2–1.2)
Total Protein: 7.4 g/dL (ref 6.0–8.3)

## 2024-06-27 LAB — B12 AND FOLATE PANEL
Folate: 13.5 ng/mL (ref >5.9)
Vitamin B-12: 462 pg/mL (ref 211–911)

## 2024-06-27 LAB — IBC + FERRITIN
Ferritin: 21.3 ng/mL (ref 10.0–291.0)
Iron: 68 ug/dL (ref 42–145)
Saturation Ratios: 18.1 % — ABNORMAL LOW (ref 20.0–50.0)
TIBC: 375.2 ug/dL (ref 250.0–450.0)
Transferrin: 268 mg/dL (ref 212.0–360.0)

## 2024-06-27 LAB — VITAMIN D 25 HYDROXY (VIT D DEFICIENCY, FRACTURES): VITD: 26.06 ng/mL — ABNORMAL LOW (ref 30.00–100.00)

## 2024-06-27 NOTE — Progress Notes (Signed)
   Subjective:    Patient ID: Ann Mckinney, female    DOB: October 11, 2001, 23 y.o.   MRN: [id number]  HPI Fatigue- pt reports that for the past few months she has had increased fatigue.  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  Feels that taking Vit D and B12 did improve energy but fatigue returned when she stopped supplements- even though labs were normal.  Pt feels tired '80-90% of the time'.  Denies SOB.  Occasional HA's.  No snoring.     Review of Systems For ROS see HPI     Objective:   Physical Exam Vitals reviewed.  Constitutional:      General: She is not in acute distress.    Appearance: Normal appearance. She is well-developed. She is not ill-appearing.  HENT:     Head: Normocephalic and atraumatic.  Eyes:     Conjunctiva/sclera: Conjunctivae normal.     Pupils: Pupils are equal, round, and reactive to light.  Neck:     Thyroid : No thyromegaly.  Cardiovascular:     Rate and Rhythm: Normal rate and regular rhythm.     Pulses: Normal pulses.     Heart sounds: Normal heart sounds. No murmur heard. Pulmonary:     Effort: Pulmonary effort is normal. No respiratory distress.     Breath sounds: Normal breath sounds.  Abdominal:     General: There is no distension.     Palpations: Abdomen is soft.     Tenderness: There is no abdominal tenderness.  Musculoskeletal:     Cervical back: Normal range of motion and neck supple.     Right lower leg: No edema.     Left lower leg: No edema.  Lymphadenopathy:     Cervical: No cervical adenopathy.  Skin:    General: Skin is warm and dry.  Neurological:     General: No focal deficit present.     Mental Status: She is alert and oriented to person, place, and time.  Psychiatric:        Mood and Affect: Mood normal.        Behavior: Behavior normal.        Thought Content: Thought content normal.           Assessment & Plan:  Fatigue- deteriorated.  Pt reports she was feeling better when taking daily Vit D and  B12.  Sxs seemed to worsen when she stopped her supplements- even though her labs were in normal range.  Will again check levels and replete prn.  Also discussed that her anxiety level is high- brother and dad recently had surgery, job has been stressful, she is trying to figure out school- and that can contribute to fatigue.  If labs are normal and adding vitamins doesn't improve her sxs, encouraged her to schedule an appt to discuss anxiety.  Pt expressed understanding and is in agreement w/ plan.

## 2024-06-27 NOTE — Patient Instructions (Signed)
 Follow up as needed or as scheduled We'll notify you of your lab results and make any changes if needed Make sure you are taking a daily multivitamin and we'll determine if there's anything else we need to add Continue to work on healthy diet and regular exercise- you look great! Call with any questions or concerns Stay Safe!  Stay Healthy! Hang in there!!!

## 2024-06-28 ENCOUNTER — Other Ambulatory Visit: Payer: Self-pay

## 2024-06-28 MED ORDER — VITAMIN D (ERGOCALCIFEROL) 1.25 MG (50000 UNIT) PO CAPS: 50000.0000 [IU] | ORAL_CAPSULE | ORAL | 0 refills | Status: AC

## 2024-06-28 NOTE — Telephone Encounter (Signed)
 I have ordered and sent in Vitamin D  supplement to patients pharmacy on file. Will send a mychart message letting patient know

## 2024-07-02 DIAGNOSIS — Z8742 Personal history of other diseases of the female genital tract: Secondary | ICD-10-CM | POA: Diagnosis not present

## 2024-07-05 DIAGNOSIS — F4320 Adjustment disorder, unspecified: Secondary | ICD-10-CM | POA: Diagnosis not present

## 2024-08-01 DIAGNOSIS — F4320 Adjustment disorder, unspecified: Secondary | ICD-10-CM | POA: Diagnosis not present

## 2024-08-15 DIAGNOSIS — Z113 Encounter for screening for infections with a predominantly sexual mode of transmission: Secondary | ICD-10-CM | POA: Diagnosis not present

## 2024-08-18 DIAGNOSIS — F4321 Adjustment disorder with depressed mood: Secondary | ICD-10-CM | POA: Diagnosis not present

## 2024-08-26 DIAGNOSIS — R059 Cough, unspecified: Secondary | ICD-10-CM | POA: Diagnosis not present

## 2024-08-26 DIAGNOSIS — J069 Acute upper respiratory infection, unspecified: Secondary | ICD-10-CM | POA: Diagnosis not present

## 2024-08-26 DIAGNOSIS — J45909 Unspecified asthma, uncomplicated: Secondary | ICD-10-CM | POA: Diagnosis not present

## 2024-08-26 DIAGNOSIS — R519 Headache, unspecified: Secondary | ICD-10-CM | POA: Diagnosis not present

## 2024-08-26 DIAGNOSIS — R55 Syncope and collapse: Secondary | ICD-10-CM | POA: Diagnosis not present

## 2024-08-26 DIAGNOSIS — R0602 Shortness of breath: Secondary | ICD-10-CM | POA: Diagnosis not present

## 2024-09-06 DIAGNOSIS — F4320 Adjustment disorder, unspecified: Secondary | ICD-10-CM | POA: Diagnosis not present

## 2024-12-26 ENCOUNTER — Encounter: Payer: BC Managed Care – PPO | Admitting: Family Medicine
